# Patient Record
Sex: Male | Born: 1985 | Race: White | Hispanic: No | Marital: Single | Smoking: Current some day smoker
Health system: Southern US, Community
[De-identification: ages and names within clinical notes are randomized; demographics above are authoritative.]

## PROBLEM LIST (undated history)

## (undated) HISTORY — PX: WISDOM TOOTH EXTRACTION: SHX21

---

## 2004-07-27 ENCOUNTER — Ambulatory Visit: Payer: Self-pay | Admitting: Internal Medicine

## 2005-04-29 ENCOUNTER — Emergency Department (HOSPITAL_COMMUNITY): Admission: EM | Admit: 2005-04-29 | Discharge: 2005-04-29 | Payer: Self-pay | Admitting: Family Medicine

## 2005-05-02 ENCOUNTER — Ambulatory Visit: Payer: Self-pay | Admitting: Internal Medicine

## 2005-05-05 ENCOUNTER — Ambulatory Visit: Payer: Self-pay | Admitting: Internal Medicine

## 2005-05-09 ENCOUNTER — Ambulatory Visit: Payer: Self-pay | Admitting: Internal Medicine

## 2006-04-29 ENCOUNTER — Emergency Department (HOSPITAL_COMMUNITY): Admission: EM | Admit: 2006-04-29 | Discharge: 2006-04-29 | Payer: Self-pay | Admitting: Family Medicine
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
## 2012-06-01 ENCOUNTER — Emergency Department (INDEPENDENT_AMBULATORY_CARE_PROVIDER_SITE_OTHER)
Admission: EM | Admit: 2012-06-01 | Discharge: 2012-06-01 | Disposition: A | Discharge status: Home / Self Care | Payer: BC Managed Care – PPO | Source: Home / Self Care | Attending: Emergency Medicine | Admitting: Emergency Medicine

## 2012-06-01 ENCOUNTER — Encounter (HOSPITAL_COMMUNITY): Payer: Self-pay

## 2012-06-01 DIAGNOSIS — R61 Generalized hyperhidrosis: Secondary | ICD-10-CM

## 2012-06-01 LAB — POCT URINALYSIS DIP (DEVICE)
Bilirubin Urine: NEGATIVE
Glucose, UA: 100 mg/dL — AB
Ketones, ur: NEGATIVE mg/dL
Nitrite: NEGATIVE
Protein, ur: 30 mg/dL — AB
Specific Gravity, Urine: 1.02 (ref 1.005–1.030)
pH: 7 (ref 5.0–8.0)

## 2012-06-01 LAB — CBC WITH DIFFERENTIAL/PLATELET
Basophils Absolute: 0.1 10*3/uL (ref 0.0–0.1)
Eosinophils Absolute: 0.1 10*3/uL (ref 0.0–0.7)
HCT: 45 % (ref 39.0–52.0)
Lymphocytes Relative: 43 % (ref 12–46)
Monocytes Absolute: 0.7 10*3/uL (ref 0.1–1.0)
Monocytes Relative: 10 % (ref 3–12)
Neutro Abs: 3.2 10*3/uL (ref 1.7–7.7)
RDW: 12.8 % (ref 11.5–15.5)
WBC: 7.2 10*3/uL (ref 4.0–10.5)

## 2012-06-01 LAB — POCT I-STAT, CHEM 8
BUN: 9 mg/dL (ref 6–23)
Creatinine, Ser: 0.9 mg/dL (ref 0.50–1.35)
HCT: 49 % (ref 39.0–52.0)
TCO2: 25 mmol/L (ref 0–100)

## 2012-06-01 LAB — HIV ANTIBODY (ROUTINE TESTING W REFLEX): HIV: NONREACTIVE

## 2012-06-01 LAB — HEMOGLOBIN A1C: Hgb A1c MFr Bld: 5.5 % (ref <5.7)

## 2012-06-01 NOTE — ED Provider Notes (Signed)
History     CSN: [id number]  Arrival date & time 06/01/12  1024   First MD Initiated Contact with Patient 06/01/12 1135      Chief Complaint  Patient presents with  . Night Sweats    (Consider location/radiation/quality/duration/timing/severity/associated sxs/prior treatment) The history is provided by the patient.  Patient complains of 6 month history of night sweats that have become increasingly worse over the past couple of weeks.  States he wakes up in the middle of the night and has to change bed linens.  Denies increased temperature in room.  No associated illness or fever.  Denies risky behavior such as drug use or sexual promiscuity.  States attempted to see primary care provider but unable to get an appointment.    History reviewed. No pertinent past medical history.  History reviewed. No pertinent past surgical history.  History reviewed. No pertinent family history.  History  Substance Use Topics  . Smoking status: Current Some Day Smoker  . Smokeless tobacco: Not on file  . Alcohol Use: No      Review of Systems  Constitutional: Positive for chills, diaphoresis and fatigue. Negative for fever, activity change, appetite change and unexpected weight change.  HENT: Negative.   Eyes: Negative.   Respiratory: Negative.   Cardiovascular: Negative.   Gastrointestinal: Negative.   Genitourinary: Negative.   Musculoskeletal: Negative.   Skin: Negative.   Neurological: Negative.   Hematological: Negative.     Allergies  Review of patient's allergies indicates no known allergies.  Home Medications  No current outpatient prescriptions on file.  BP 114/67  Pulse 70  Temp 98 F (36.7 C) (Oral)  Resp 16  SpO2 99%  Physical Exam  Nursing note and vitals reviewed. Constitutional: He is oriented to person, place, and time. Vital signs are normal. He appears well-developed and well-nourished. He is active and cooperative. He does not have a sickly appearance. He  does not appear ill.  HENT:  Head: Normocephalic.  Right Ear: Hearing, tympanic membrane, external ear and ear canal normal.  Left Ear: Hearing, tympanic membrane, external ear and ear canal normal.  Nose: Nose normal. Right sinus exhibits no maxillary sinus tenderness and no frontal sinus tenderness. Left sinus exhibits no maxillary sinus tenderness and no frontal sinus tenderness.  Mouth/Throat: Uvula is midline, oropharynx is clear and moist and mucous membranes are normal. No oropharyngeal exudate.  Eyes: Conjunctivae, EOM and lids are normal. Pupils are equal, round, and reactive to light. No scleral icterus.  Neck: Trachea normal and normal range of motion. Neck supple. No spinous process tenderness and no muscular tenderness present. No tracheal deviation present. No thyromegaly present.  Cardiovascular: Normal rate, regular rhythm, normal heart sounds, intact distal pulses and normal pulses.   Pulmonary/Chest: Effort normal and breath sounds normal.  Abdominal: Soft. Normal appearance and bowel sounds are normal. There is no hepatosplenomegaly. There is no tenderness. There is no rebound and no CVA tenderness.  Lymphadenopathy:       Head (right side): No submental, no submandibular, no tonsillar, no preauricular, no posterior auricular and no occipital adenopathy present.       Head (left side): No submental, no submandibular, no tonsillar, no preauricular, no posterior auricular and no occipital adenopathy present.    He has no cervical adenopathy.    He has no axillary adenopathy.       Right: No inguinal adenopathy present.       Left: No inguinal adenopathy present.  Neurological: He is alert  and oriented to person, place, and time. He has normal strength. No cranial nerve deficit or sensory deficit. Coordination and gait normal. GCS eye subscore is 4. GCS verbal subscore is 5. GCS motor subscore is 6.  Skin: Skin is warm and dry. No rash noted.  Psychiatric: He has a normal mood  and affect. His speech is normal and behavior is normal. Judgment and thought content normal. Cognition and memory are normal.    ED Course  Procedures (including critical care time)  Labs Reviewed  POCT URINALYSIS DIP (DEVICE) - Abnormal; Notable for the following:    Glucose, UA 100 (*)     Protein, ur 30 (*)     All other components within normal limits  POCT I-STAT, CHEM 8  CBC WITH DIFFERENTIAL  TSH  GC/CHLAMYDIA PROBE AMP, URINE  HIV ANTIBODY (ROUTINE TESTING)  HEMOGLOBIN A1C   No results found.   1. Night sweats       MDM  Await lab results.  Follow up with primary care provider for additional testing as warranted.          Johnsie Kindred, NP 06/01/12 1259

## 2012-06-01 NOTE — ED Notes (Signed)
States he has night sweats for past year or so, worse over past few months, worse still past couple of weeks. Denies any cough, denies STD, HIV concerns

## 2012-06-02 LAB — GC/CHLAMYDIA PROBE AMP, URINE
Chlamydia, Swab/Urine, PCR: NEGATIVE
GC Probe Amp, Urine: NEGATIVE

## 2012-06-02 NOTE — ED Provider Notes (Signed)
Medical screening examination/treatment/procedure(s) were performed by non-physician practitioner and as supervising physician I was immediately available for consultation/collaboration.  Luiz Blare MD   Luiz Blare, MD 06/02/12 [phone]

## 2012-06-13 ENCOUNTER — Encounter: Payer: Self-pay | Admitting: Internal Medicine

## 2012-06-13 ENCOUNTER — Ambulatory Visit (INDEPENDENT_AMBULATORY_CARE_PROVIDER_SITE_OTHER): Payer: BC Managed Care – PPO | Admitting: Internal Medicine

## 2012-06-13 VITALS — BP 112/68 | HR 72 | Temp 98.0°F | Wt 172.0 lb

## 2012-06-13 DIAGNOSIS — Z833 Family history of diabetes mellitus: Secondary | ICD-10-CM

## 2012-06-13 DIAGNOSIS — R61 Generalized hyperhidrosis: Secondary | ICD-10-CM

## 2012-06-13 DIAGNOSIS — R81 Glycosuria: Secondary | ICD-10-CM

## 2012-06-13 DIAGNOSIS — F17200 Nicotine dependence, unspecified, uncomplicated: Secondary | ICD-10-CM

## 2012-06-13 LAB — HEPATIC FUNCTION PANEL
AST: 18 U/L (ref 0–37)
Albumin: 4.7 g/dL (ref 3.5–5.2)
Alkaline Phosphatase: 56 U/L (ref 39–117)

## 2012-06-13 NOTE — Progress Notes (Signed)
Subjective:    Patient ID: Gerald Henderson, male    DOB: 08-23-1986, 26 y.o.   MRN: [id number]  HPI His night sweats began 6-8 months ago but have been more frequent and pronounced in the last month. This is manifested as profound diaphoresis 3-4 times a month usually on the weekends while he is asleep at night. During the week he works third shift; and he sleeps during the day.  He was seen at [facility] 06/01/12. Nonfasting glucose was 111; A1c was 5.5% . There was glucose in the urine and a small amount of protein. BUN was 9 & creatinine 0.9.  He does ingest drinks with high fructose corn syrup     Review of Systems Constitutional: No fever, chills, significant weight change, fatigue, weakness  Eyes: No redness, discharge, pain, blurred vision, double vision, or loss of vision ENT/mouth: No nasal congestion, postnasal drainage,epistaxis, purulent discharge, earache, hearing loss, tinnitus ,sore throat , dental pain, or hoarseness   Cardiovascular: no chest pain, palpitations, racing, irregular rhythm, syncope, nausea,  claudication, or edema  Respiratory: No cough, sputum production,hemoptysis,  dyspnea, paroxysmal nocturnal dyspnea, pleuritic chest pain, significant snoring, or  apnea    Gastrointestinal: No heartburn,dysphagia, nausea and vomiting,abdominal pain, change in bowels, anorexia, diarrhea, significant constipation, rectal bleeding, melena,  or jaundice Genitourinary: No dysuria,hematuria, pyuria, frequency, urgency,  incontinence, nocturia, dark urine or flank pain Musculoskeletal: No myalgias or muscle cramping, joint stiffness, joint swelling, joint color change, weakness, or cyanosis Dermatologic: No rash, pruritus, urticaria, or change in color or temperature of skin Neurologic: No headache, vertigo, limb weakness, tremor, gait disturbance, seizures,  numbness or tingling Psychiatric: No significant anxiety or depression, anhedonia, panic attacks, insomnia, or  anorexia Endocrine: No change in hair/skin/ nails, excessive thirst, excessive hunger, excessive urination, or unexplained fatigue Hematologic/lymphatic: No bruising, lymphadenopathy,or  abnormal clotting Allergy/immunology: No itchy/ watery eyes, abnormal sneezing, rhinitis, urticaria ,or angioedema      Objective:   Physical Exam Gen.: Thin but healthy and well-nourished in appearance. Alert, appropriate and cooperative throughout exam. Head: Normocephalic without obvious abnormalities; slight alopecia  Eyes: No corneal or conjunctival inflammation noted. No lid lag or proptosis. Extraocular motion intact. FOV normal Nose: External nasal exam reveals no deformity or inflammation. Nasal mucosa are boggy &  moist. No lesions or exudates noted.  Mouth: Oral mucosa and oropharynx reveal no lesions or exudates. Teeth in good repair. Neck: No deformities, masses, or tenderness noted. Range of motion & Thyroid normal. Lungs: Normal respiratory effort; chest expands symmetrically. Lungs are clear to auscultation without rales, wheezes, or increased work of breathing. Heart: Normal rate and rhythm. Normal S1 and S2. No gallop, click, or rub. S4 with slurring @ apex; no murmur. Abdomen: Bowel sounds normal; abdomen soft and nontender. No masses, organomegaly or hernias noted. Genitalia: Genitalia normal except for left varices & small hydrocoele                                                                             Musculoskeletal/extremities: No deformity or scoliosis noted of  the thoracic or lumbar spine. No clubbing, cyanosis, edema, or deformity noted. Range of motion  normal .Tone & strength  normal.Joints normal. Nail  health  good. Vascular: Carotid, radial artery, dorsalis pedis and  posterior tibial pulses are full and equal. No bruits present. Neurologic: Alert and oriented x3. Deep tendon reflexes symmetrical and normal.          Skin: Intact without suspicious lesions or rashes.Tatooes  of back & extremities Lymph: No cervical, axillary, or inguinal lymphadenopathy present. Psych: Mood and affect are normal. Normally interactive                                                                                         Assessment & Plan:   #1 profound nocturnal diaphoresis several times a month when he sleeps at night. No significant signs or symptoms. This could be related to the time  changes in his sleep pattern because of his work schedule. #2 glucose urea in the context of family history of diabetes. Nondiabetic A1c.  Plan: See orders and recommendations

## 2012-06-13 NOTE — Patient Instructions (Addendum)
Take melatonin 30-60 minutes prior to bedtime the nights you sleeps at night Eat a low-fat diet with lots of fruits and vegetables, up to 7-9 servings per day. Consume less than 40 ( preferably ZERO) grams of sugar per day from foods & drinks with High Fructose Corn Syrup (HFCS) sugar as #1,2,3 or # 4 on label.Whole [employer], [employer] & [employer] do not carry products with HFCS. Follow a  low carb nutrition program such as [place] or The New Sugar Busters  to prevent Diabetes  . White carbohydrates (potatoes, rice, bread, and pasta) have a high spike of sugar and a high load of sugar. For example a  baked potato has a cup of sugar and a  french fry  2 teaspoons of sugar. Yams, wild  rice, whole grained bread &  wheat pasta have been much lower spike and load of  sugar. Portions should be the size of a deck of cards or your palm.  Order for x-rays entered into  the computer; these will be performed at [facility]. No appointment is necessary.     If you activate My Chart; the results can be released to you as soon as they populate from the lab. If you choose not to use this program; the labs have to be reviewed, copied & mailed   causing a delay in getting the results to you.

## 2013-03-27 ENCOUNTER — Emergency Department (HOSPITAL_COMMUNITY): Payer: BC Managed Care – PPO

## 2013-03-27 ENCOUNTER — Emergency Department (HOSPITAL_COMMUNITY)
Admission: EM | Admit: 2013-03-27 | Discharge: 2013-03-27 | Disposition: A | Discharge status: Home / Self Care | Payer: BC Managed Care – PPO | Attending: Emergency Medicine | Admitting: Emergency Medicine

## 2013-03-27 ENCOUNTER — Encounter (HOSPITAL_COMMUNITY): Payer: Self-pay | Admitting: Emergency Medicine

## 2013-03-27 DIAGNOSIS — R079 Chest pain, unspecified: Secondary | ICD-10-CM | POA: Insufficient documentation

## 2013-03-27 DIAGNOSIS — K529 Noninfective gastroenteritis and colitis, unspecified: Secondary | ICD-10-CM

## 2013-03-27 DIAGNOSIS — J982 Interstitial emphysema: Secondary | ICD-10-CM | POA: Insufficient documentation

## 2013-03-27 DIAGNOSIS — F17200 Nicotine dependence, unspecified, uncomplicated: Secondary | ICD-10-CM | POA: Insufficient documentation

## 2013-03-27 DIAGNOSIS — R1013 Epigastric pain: Secondary | ICD-10-CM | POA: Insufficient documentation

## 2013-03-27 DIAGNOSIS — R197 Diarrhea, unspecified: Secondary | ICD-10-CM | POA: Insufficient documentation

## 2013-03-27 DIAGNOSIS — K5289 Other specified noninfective gastroenteritis and colitis: Secondary | ICD-10-CM | POA: Insufficient documentation

## 2013-03-27 LAB — COMPREHENSIVE METABOLIC PANEL
ALT: 19 U/L (ref 0–53)
AST: 21 U/L (ref 0–37)
Albumin: 4.4 g/dL (ref 3.5–5.2)
Alkaline Phosphatase: 54 U/L (ref 39–117)
Calcium: 9.1 mg/dL (ref 8.4–10.5)
Glucose, Bld: 153 mg/dL — ABNORMAL HIGH (ref 70–99)
Total Bilirubin: 0.9 mg/dL (ref 0.3–1.2)

## 2013-03-27 LAB — CBC WITH DIFFERENTIAL/PLATELET
Basophils Absolute: 0 10*3/uL (ref 0.0–0.1)
HCT: 40.6 % (ref 39.0–52.0)
Lymphocytes Relative: 7 % — ABNORMAL LOW (ref 12–46)
Lymphs Abs: 1.7 10*3/uL (ref 0.7–4.0)
MCH: 31 pg (ref 26.0–34.0)
MCHC: 35.7 g/dL (ref 30.0–36.0)
Monocytes Absolute: 2.2 10*3/uL — ABNORMAL HIGH (ref 0.1–1.0)
Neutrophils Relative %: 84 % — ABNORMAL HIGH (ref 43–77)
Platelets: 233 10*3/uL (ref 150–400)
RDW: 12.6 % (ref 11.5–15.5)
WBC: 24.2 10*3/uL — ABNORMAL HIGH (ref 4.0–10.5)

## 2013-03-27 MED ORDER — SODIUM CHLORIDE 0.9 % IV BOLUS (SEPSIS)
1000.0000 mL | Freq: Once | INTRAVENOUS | Status: AC
  Administered 2013-03-27: 1000 mL via INTRAVENOUS

## 2013-03-27 MED ORDER — FAMOTIDINE IN NACL 20-0.9 MG/50ML-% IV SOLN
20.0000 mg | Freq: Once | INTRAVENOUS | Status: AC
  Administered 2013-03-27: 20 mg via INTRAVENOUS
  Filled 2013-03-27: qty 50

## 2013-03-27 MED ORDER — POTASSIUM CHLORIDE 10 MEQ/100ML IV SOLN
10.0000 meq | Freq: Once | INTRAVENOUS | Status: AC
  Administered 2013-03-27: 10 meq via INTRAVENOUS
  Filled 2013-03-27: qty 100

## 2013-03-27 MED ORDER — PANTOPRAZOLE SODIUM 40 MG IV SOLR
40.0000 mg | Freq: Once | INTRAVENOUS | Status: AC
  Administered 2013-03-27: 40 mg via INTRAVENOUS
  Filled 2013-03-27: qty 40

## 2013-03-27 MED ORDER — PROMETHAZINE HCL 25 MG/ML IJ SOLN
12.5000 mg | Freq: Once | INTRAMUSCULAR | Status: AC
  Administered 2013-03-27: 12.5 mg via INTRAVENOUS
  Filled 2013-03-27: qty 1

## 2013-03-27 MED ORDER — PROMETHAZINE HCL 25 MG PO TABS: 25.0000 mg | ORAL_TABLET | Freq: Four times a day (QID) | ORAL | Status: AC | PRN

## 2013-03-27 NOTE — ED Provider Notes (Signed)
History    CSN: [id number] Arrival date & time 03/27/13  1123  First MD Initiated Contact with Patient 03/27/13 1128     Chief Complaint  Patient presents with  . Nausea  . Emesis   (Consider location/radiation/quality/duration/timing/severity/associated sxs/prior Treatment) Patient is a 27 y.o. male presenting with vomiting. The history is provided by the patient. No language interpreter was used.  Emesis Severity:  Severe Associated symptoms: abdominal pain and diarrhea   Associated symptoms: no chills, no headaches and no myalgias   Associated symptoms comment:  Onset last night around 8:00 p.m. Of nausea, vomiting, diarrhea. He reports epigastric and upper chest pain and seeing blood in his emesis. No fever. No melena. No sick contacts.  History reviewed. No pertinent past medical history. Past Surgical History  Procedure Laterality Date  . Wisdom tooth extraction     Family History  Problem Relation Age of Onset  . Diabetes Maternal Grandfather   . Cancer Neg Hx   . Heart disease Neg Hx   . Hypertension Neg Hx   . Stroke Neg Hx    History  Substance Use Topics  . Smoking status: Current Some Day Smoker  . Smokeless tobacco: Not on file     Comment: about 2-3 packs per month ; uses Snuff  . Alcohol Use: No    Review of Systems  Constitutional: Negative for fever and chills.  HENT: Negative.  Negative for trouble swallowing.   Respiratory: Negative.  Negative for cough and shortness of breath.   Cardiovascular: Positive for chest pain.  Gastrointestinal: Positive for nausea, vomiting, abdominal pain and diarrhea.       See HPI.  Genitourinary: Negative for dysuria.  Musculoskeletal: Negative.  Negative for myalgias.  Skin: Negative.  Negative for rash.  Neurological: Negative.  Negative for headaches.    Allergies  Review of patient's allergies indicates no known allergies.  Home Medications  No current outpatient prescriptions on file. BP 126/49  Pulse  78  Temp(Src) 97.5 F (36.4 C)  Resp 26  SpO2 99% Physical Exam  Constitutional: He is oriented to person, place, and time. He appears well-developed and well-nourished.  HENT:  Head: Normocephalic.  Neck: Normal range of motion. Neck supple.  Cardiovascular: Normal rate and regular rhythm.   Pulmonary/Chest: Effort normal and breath sounds normal.  Abdominal: Soft. Bowel sounds are normal. There is no tenderness. There is no rebound and no guarding.  Musculoskeletal: Normal range of motion.  Neurological: He is alert and oriented to person, place, and time.  Skin: Skin is warm and dry. No rash noted.  Psychiatric: He has a normal mood and affect.    ED Course  Procedures (including critical care time) Labs Reviewed  CBC WITH DIFFERENTIAL  COMPREHENSIVE METABOLIC PANEL  LIPASE, BLOOD   Results for orders placed during the hospital encounter of 03/27/13  CBC WITH DIFFERENTIAL      Result Value Range   WBC 24.2 (*) 4.0 - 10.5 K/uL   RBC 4.67  4.22 - 5.81 MIL/uL   Hemoglobin 14.5  13.0 - 17.0 g/dL   HCT 40.9  81.1 - 91.4 %   MCV 86.9  78.0 - 100.0 fL   MCH 31.0  26.0 - 34.0 pg   MCHC 35.7  30.0 - 36.0 g/dL   RDW 78.2  95.6 - 21.3 %   Platelets 233  150 - 400 K/uL   Neutrophils Relative % 84 (*) 43 - 77 %   Neutro Abs 20.3 (*) 1.7 -  7.7 K/uL   Lymphocytes Relative 7 (*) 12 - 46 %   Lymphs Abs 1.7  0.7 - 4.0 K/uL   Monocytes Relative 9  3 - 12 %   Monocytes Absolute 2.2 (*) 0.1 - 1.0 K/uL   Eosinophils Relative 0  0 - 5 %   Eosinophils Absolute 0.0  0.0 - 0.7 K/uL   Basophils Relative 0  0 - 1 %   Basophils Absolute 0.0  0.0 - 0.1 K/uL  COMPREHENSIVE METABOLIC PANEL      Result Value Range   Sodium 138  135 - 145 mEq/L   Potassium 2.9 (*) 3.5 - 5.1 mEq/L   Chloride 97  96 - 112 mEq/L   CO2 25  19 - 32 mEq/L   Glucose, Bld 153 (*) 70 - 99 mg/dL   BUN 26 (*) 6 - 23 mg/dL   Creatinine, Ser 1.61  0.50 - 1.35 mg/dL   Calcium 9.1  8.4 - 09.6 mg/dL   Total Protein 7.2   6.0 - 8.3 g/dL   Albumin 4.4  3.5 - 5.2 g/dL   AST 21  0 - 37 U/L   ALT 19  0 - 53 U/L   Alkaline Phosphatase 54  39 - 117 U/L   Total Bilirubin 0.9  0.3 - 1.2 mg/dL   GFR calc non Af Amer 84 (*) >90 mL/min   GFR calc Af Amer >90  >90 mL/min  LIPASE, BLOOD      Result Value Range   Lipase 13  11 - 59 U/L    No results found. No diagnosis found. 1. Gastroenteritis 2. pneumomediastinum   MDM  Actively vomiting after Zofran, appears pale, diaphoretic. IV fluids given with Phenergan. He is feeling better, tolerating PO fluids. Still complaining of sharp chest pain from epigastrium to sternal notch. CXR performed. He has pneumomediastinum on X-ray - results discussed with him and symptoms that would prompt return to ED.   Arnoldo Hooker, PA-C 03/27/13 [phone]

## 2013-03-27 NOTE — ED Notes (Signed)
Pt ED with c/o nausea and vomiting, onset last night. Pt states he notice blood in vomit. Pt is not taking antibiotics. Per EMS BP-120/70, HR-78, CBG-159, O2-100% on room air. Pt reports numbness and tingling in fingers that resolved. Pt also reports that he is feeling week. EMS given 4mg  Zofran.

## 2013-03-27 NOTE — ED Provider Notes (Signed)
Medical screening examination/treatment/procedure(s) were performed by non-physician practitioner and as supervising physician I was immediately available for consultation/collaboration.  Clemmie Marxen, MD 03/27/13 1617 

## 2021-06-17 ENCOUNTER — Other Ambulatory Visit: Payer: Self-pay

## 2021-06-17 ENCOUNTER — Ambulatory Visit (HOSPITAL_COMMUNITY)
Admission: EM | Admit: 2021-06-17 | Discharge: 2021-06-17 | Disposition: A | Discharge status: Home / Self Care | Payer: BC Managed Care – PPO | Attending: Emergency Medicine | Admitting: Emergency Medicine

## 2021-06-17 ENCOUNTER — Encounter (HOSPITAL_COMMUNITY): Payer: Self-pay

## 2021-06-17 DIAGNOSIS — S29012A Strain of muscle and tendon of back wall of thorax, initial encounter: Secondary | ICD-10-CM

## 2021-06-17 MED ORDER — CYCLOBENZAPRINE HCL 5 MG PO TABS: 5.0000 mg | ORAL_TABLET | Freq: Three times a day (TID) | ORAL | 0 refills | Status: AC | PRN

## 2021-06-17 MED ORDER — IBUPROFEN 600 MG PO TABS: 600.0000 mg | ORAL_TABLET | Freq: Three times a day (TID) | ORAL | 0 refills | Status: AC

## 2021-06-17 NOTE — ED Triage Notes (Signed)
Pt reports right scapular pain x 1-1 1/2 year. Tylenol gives no relief.

## 2021-06-17 NOTE — ED Provider Notes (Signed)
[facility]    CSN: [id number] Arrival date & time: 06/17/21  0813      History   Chief Complaint Chief Complaint  Patient presents with   Back Pain    HPI Gerald Henderson is a 35 y.o. male.  He reports right upper back pain for 1-1/2 years.  Denies specific injury or incident that triggered the initial pain.  Pain is intermittent.  Used to be relieved by Tylenol, but patient reports worsening pain over the last couple of weeks that is not relieved by Tylenol.  Last took Tylenol last night.  Denies weakness, numbness, tingling.  Pain does not radiate.  Pain is located medially to the right scapula.  Spine is not involved.  Pain is aggravated by use of his right upper extremity in certain positions.  Pain is also aggravated by heavy lifting and pitching baseballs.   Back Pain Associated symptoms: no numbness and no weakness    History reviewed. No pertinent past medical history.  There are no problems to display for this patient.   Past Surgical History:  Procedure Laterality Date   WISDOM TOOTH EXTRACTION         Home Medications    Prior to Admission medications   Medication Sig Start Date End Date Taking? Authorizing Provider  cyclobenzaprine (FLEXERIL) 5 MG tablet Take 1 tablet (5 mg total) by mouth 3 (three) times daily as needed for muscle spasms. 06/17/21  Yes Cathlyn Parsons, NP  ibuprofen (ADVIL) 600 MG tablet Take 1 tablet (600 mg total) by mouth 3 (three) times daily. 06/17/21  Yes Cathlyn Parsons, NP  promethazine (PHENERGAN) 25 MG tablet Take 1 tablet (25 mg total) by mouth every 6 (six) hours as needed for nausea. 03/27/13   Elpidio Anis, PA-C    Family History Family History  Problem Relation Age of Onset   Diabetes Maternal Grandfather    Cancer Neg Hx    Heart disease Neg Hx    Hypertension Neg Hx    Stroke Neg Hx     Social History Social History   Tobacco Use   Smoking status: Some Days    Types: Cigarettes   Smokeless tobacco:  Never   Tobacco comments:    about 2-3 packs per month ; uses Snuff  Substance Use Topics   Alcohol use: No   Drug use: No     Allergies   Patient has no known allergies.   Review of Systems Review of Systems  Musculoskeletal:  Positive for back pain.  Neurological:  Negative for weakness and numbness.       No loss of function.    Physical Exam Triage Vital Signs ED Triage Vitals  Enc Vitals Group     BP 06/17/21 0901 118/86     Pulse Rate 06/17/21 0901 (!) 56     Resp 06/17/21 0901 16     Temp 06/17/21 0901 98.2 F (36.8 C)     Temp Source 06/17/21 0901 Oral     SpO2 06/17/21 0901 98 %     Weight --      Height --      Head Circumference --      Peak Flow --      Pain Score 06/17/21 0900 8     Pain Loc --      Pain Edu? --      Excl. in GC? --    No data found.  Updated Vital Signs BP 118/86 (BP Location:  Left Arm)   Pulse (!) 56   Temp 98.2 F (36.8 C) (Oral)   Resp 16   SpO2 98%   Visual Acuity Right Eye Distance:   Left Eye Distance:   Bilateral Distance:    Right Eye Near:   Left Eye Near:    Bilateral Near:     Physical Exam Constitutional:      Appearance: Normal appearance.  Musculoskeletal:     Comments: Range of motion of bilateral shoulders intact.  No tenderness to palpation of cervical or thoracic spine.  No tenderness to palpation of right scapula or clavicle.  Tenderness to palpation to the right upper back medially from the right scapula in trapezius area.  Strength intact bilateral upper extremities.  Neurological:     General: No focal deficit present.     Mental Status: He is alert and oriented to person, place, and time.     Sensory: No sensory deficit.     Motor: No weakness.     Gait: Gait normal.     UC Treatments / Results  Labs (all labs ordered are listed, but only abnormal results are displayed) Labs Reviewed - No data to display  EKG   Radiology No results found.  Procedures Procedures (including  critical care time)  Medications Ordered in UC Medications - No data to display  Initial Impression / Assessment and Plan / UC Course  I have reviewed the triage vital signs and the nursing notes.  Pertinent labs & imaging results that were available during my care of the patient were reviewed by me and considered in my medical decision making (see chart for details).  I suspect this is a muscle strain with initial injury approximately a year and a half ago occasionally reinjures it causing symptoms that he has now.  Reviewed care for muscle strain.   Final Clinical Impressions(s) / UC Diagnoses   Final diagnoses:  Muscle strain of right upper back, initial encounter     Discharge Instructions      Take ibuprofen 3 times a day as prescribed.  You may also take 3 over-the-counter ibuprofen tablets 3 times a day if you prefer not to get the prescription filled.  The cyclobenzaprine muscle relaxer can be taken up to 3 times a day, but it does make you sleepy.  If you need to work or drive etc. you can take it only at bedtime.  Try using heat to your upper back twice a day for 15 minutes each time.  You can use a heating pad or just warm compresses.  Please try to rest that muscle is much as possible for the next week by avoiding heavy lifting and overuse such as from baseball pitching.   ED Prescriptions     Medication Sig Dispense Auth. Provider   ibuprofen (ADVIL) 600 MG tablet Take 1 tablet (600 mg total) by mouth 3 (three) times daily. 30 tablet Cathlyn Parsons, NP   cyclobenzaprine (FLEXERIL) 5 MG tablet Take 1 tablet (5 mg total) by mouth 3 (three) times daily as needed for muscle spasms. 21 tablet Cathlyn Parsons, NP      PDMP not reviewed this encounter.   Cathlyn Parsons, NP 06/17/21 [phone]

## 2021-06-17 NOTE — Discharge Instructions (Addendum)
Take ibuprofen 3 times a day as prescribed.  You may also take 3 over-the-counter ibuprofen tablets 3 times a day if you prefer not to get the prescription filled.  The cyclobenzaprine muscle relaxer can be taken up to 3 times a day, but it does make you sleepy.  If you need to work or drive etc. you can take it only at bedtime.  Try using heat to your upper back twice a day for 15 minutes each time.  You can use a heating pad or just warm compresses.  Please try to rest that muscle is much as possible for the next week by avoiding heavy lifting and overuse such as from baseball pitching.
